# Patient Record
Sex: Male | Born: 1995 | Race: White | Hispanic: No | Marital: Single
Health system: Southern US, Community
[De-identification: ages and names within clinical notes are randomized; demographics above are authoritative.]

---

## 2018-10-18 ENCOUNTER — Emergency Department
Admission: EM | Admit: 2018-10-18 | Discharge: 2018-10-18 | Disposition: A | Discharge status: Home / Self Care | Payer: BC Managed Care – PPO | Attending: Emergency Medicine | Admitting: Emergency Medicine

## 2018-10-18 ENCOUNTER — Other Ambulatory Visit: Payer: Self-pay

## 2018-10-18 ENCOUNTER — Emergency Department: Payer: BC Managed Care – PPO

## 2018-10-18 DIAGNOSIS — R1032 Left lower quadrant pain: Secondary | ICD-10-CM | POA: Insufficient documentation

## 2018-10-18 DIAGNOSIS — R109 Unspecified abdominal pain: Secondary | ICD-10-CM

## 2018-10-18 DIAGNOSIS — N200 Calculus of kidney: Secondary | ICD-10-CM | POA: Diagnosis not present

## 2018-10-18 LAB — URINALYSIS, COMPLETE (UACMP) WITH MICROSCOPIC
Bacteria, UA: NONE SEEN
Bilirubin Urine: NEGATIVE
Glucose, UA: NEGATIVE mg/dL
Ketones, ur: NEGATIVE mg/dL
Leukocytes,Ua: NEGATIVE
Nitrite: NEGATIVE
Protein, ur: 30 mg/dL — AB
RBC / HPF: >50 RBC/hpf — ABNORMAL HIGH (ref 0–5)
Specific Gravity, Urine: 1.025 (ref 1.005–1.030)
pH: 5 (ref 5.0–8.0)

## 2018-10-18 LAB — CBC
HCT: 46 % (ref 39.0–52.0)
Hemoglobin: 15.5 g/dL (ref 13.0–17.0)
MCH: 29.6 pg (ref 26.0–34.0)
MCHC: 33.7 g/dL (ref 30.0–36.0)
MCV: 87.8 fL (ref 80.0–100.0)
Platelets: 285 10*3/uL (ref 150–400)
RBC: 5.24 MIL/uL (ref 4.22–5.81)
RDW: 11.9 % (ref 11.5–15.5)
WBC: 7.1 10*3/uL (ref 4.0–10.5)
nRBC: 0 % (ref 0.0–0.2)

## 2018-10-18 LAB — BASIC METABOLIC PANEL
Anion gap: 7 (ref 5–15)
BUN: 21 mg/dL — ABNORMAL HIGH (ref 6–20)
CO2: 26 mmol/L (ref 22–32)
Calcium: 8.9 mg/dL (ref 8.9–10.3)
Chloride: 105 mmol/L (ref 98–111)
Creatinine, Ser: 0.87 mg/dL (ref 0.61–1.24)
GFR calc Af Amer: >60 mL/min (ref >60)
GFR calc non Af Amer: >60 mL/min (ref >60)
Glucose, Bld: 115 mg/dL — ABNORMAL HIGH (ref 70–99)
Potassium: 3.8 mmol/L (ref 3.5–5.1)
Sodium: 138 mmol/L (ref 135–145)

## 2018-10-18 MED ORDER — KETOROLAC TROMETHAMINE 30 MG/ML IJ SOLN
15.0000 mg | Freq: Once | INTRAMUSCULAR | Status: AC
  Administered 2018-10-18: 15 mg via INTRAVENOUS
  Filled 2018-10-18: qty 1

## 2018-10-18 MED ORDER — ONDANSETRON 4 MG PO TBDP: 4.0000 mg | ORAL_TABLET | Freq: Four times a day (QID) | ORAL | 0 refills | Status: AC | PRN

## 2018-10-18 MED ORDER — HYDROCODONE-ACETAMINOPHEN 5-325 MG PO TABS: 1.0000 | ORAL_TABLET | Freq: Four times a day (QID) | ORAL | 0 refills | Status: AC | PRN

## 2018-10-18 MED ORDER — SODIUM CHLORIDE 0.9 % IV BOLUS
1000.0000 mL | Freq: Once | INTRAVENOUS | Status: AC
  Administered 2018-10-18: 1000 mL via INTRAVENOUS

## 2018-10-18 MED ORDER — MORPHINE SULFATE (PF) 4 MG/ML IV SOLN
6.0000 mg | Freq: Once | INTRAVENOUS | Status: AC
  Administered 2018-10-18: 06:00:00 6 mg via INTRAVENOUS
  Filled 2018-10-18: qty 2

## 2018-10-18 NOTE — ED Notes (Signed)
Pt c/o severe pain with sudden onset that woke pt this am, pt denies hx of stones but reports poor "flow", father at bedside reports family hx of same

## 2018-10-18 NOTE — ED Notes (Signed)
Discussed results with Dr Ellender Hose; he would prefer pt to be seen by provider before any further orders are given; pt waiting patiently in waiting room for treatment room; awake and alert;

## 2018-10-18 NOTE — Discharge Instructions (Signed)
You have been seen in the Emergency Department (ED) today for pain that we believe based on your workup, is caused by kidney stones.  As we have discussed, please drink plenty of fluids.  Please make a follow up appointment with the physician(s) listed elsewhere in this documentation. ° °You may take pain medication as needed but ONLY as prescribed.   We also recommend that you take over-the-counter ibuprofen regularly according to label instructions over the next 5 days.  Take it with meals to minimize stomach discomfort. ° °Please see your doctor as soon as possible as stones may take 1-3 weeks to pass and you may require additional care or medications. ° °Do not drink alcohol, drive or participate in any other potentially dangerous activities while taking opiate pain medication as it may make you sleepy. Do not take this medication with any other sedating medications, either prescription or over-the-counter. If you were prescribed Percocet or Vicodin, do not take these with acetaminophen (Tylenol) as it is already contained within these medications. °  °This medication is an opiate (or narcotic) pain medication and can be habit forming.  Use it as little as possible to achieve adequate pain control.  Do not use or use it with extreme caution if you have a history of opiate abuse or dependence.  If you are on a pain contract with your primary care doctor or a pain specialist, be sure to let them know you were prescribed this medication today from the [facility].  This medication is intended for your use only - do not give any to anyone else and keep it in a secure place where nobody else, especially children, have access to it.  It will also cause or worsen constipation, so you may want to consider taking an over-the-counter stool softener while you are taking this medication. ° °Return to the Emergency Department (ED) or call your doctor if you have any worsening pain, fever, painful  urination, are unable to urinate, or develop other symptoms that concern you. ° °

## 2018-10-18 NOTE — ED Provider Notes (Signed)
3 mm stone position within the posterior bladder lumen just beyond the left UVJ, consistent with a recently passed stone. No significant hydronephrosis or hydroureter. 2. Additional bilateral nonobstructive nephrolithiasis as above.   CT imaging reviewed.  Reviewed diagnosis, reevaluate patient.  Is resting comfortably pain is much better.  Follow the bedside as well.  He is comfortable with plan for discharge. I will prescribe the patient a narcotic pain medicine due to their condition which I anticipate will cause at least moderate pain short term. I discussed with the patient safe use of narcotic pain medicines, and that they are not to drive, work in dangerous areas, or ever take more than prescribed (no more than 1 pill every 6 hours). We discussed that this is the type of medication that can be  overdosed on and the risks of this type of medicine. Patient is very agreeable to only use as prescribed and to never use more than prescribed.   Father will be driving home.  He will follow-up with urology.  Pain well controlled.  No evidence of acute infectious etiology.  Stable for outpatient management of kidney stone.   Delman Kitten, MD 10/18/18 0900

## 2018-10-18 NOTE — ED Notes (Signed)
Patient transported to CT 

## 2018-10-18 NOTE — ED Provider Notes (Signed)
[facility] Emergency Department Provider Note  ____________________________________________   First MD Initiated Contact with Patient 10/18/18 [phone]     (approximate)  I have reviewed the triage vital signs and the nursing notes.   HISTORY  Chief Complaint Flank Pain    HPI Mitchell Bautista is a 23 y.o. male here with left flank pain.  The patient states he was in his usual state of health until prior to arrival.  Reports that he felt the need to urinate and experienced acute onset of severe, aching, gnawing, left flank pain.  He had associated nausea and vomiting.  He states that the pain has persisted and was severe.  He had some mild improvement but then returned to being severe while in the waiting room.  Scribes as aching, gnawing, cramp-like sensation.  Does not move.  No leaving factors.  No history of similar symptoms.  Has a family history of kidney stones but no personal history.        No past medical history on file. PMHx: None  PSHx: none, no intra abd surgeries  Shx; noncontributory  There are no active problems to display for this patient.    Prior to Admission medications   Medication Sig Start Date End Date Taking? Authorizing Provider  HYDROcodone-acetaminophen (NORCO/VICODIN) 5-325 MG tablet Take 1-2 tablets by mouth every 6 (six) hours as needed for moderate pain. 10/18/18   Sharyn CreamerQuale, Mark, MD  ondansetron (ZOFRAN ODT) 4 MG disintegrating tablet Take 1 tablet (4 mg total) by mouth every 6 (six) hours as needed for nausea or vomiting. 10/18/18   Sharyn CreamerQuale, Mark, MD    Allergies Patient has no known allergies.  No family history on file.  Social History Social History   Tobacco Use   Smoking status: Not on file  Substance Use Topics   Alcohol use: Not on file   Drug use: Not on file    Review of Systems  Review of Systems  Constitutional: Negative for chills, fatigue and fever.  HENT: Negative for sore throat.   Respiratory:  Negative for shortness of breath.   Cardiovascular: Negative for chest pain.  Gastrointestinal: Positive for abdominal pain, nausea and vomiting.  Genitourinary: Positive for flank pain and frequency.  Musculoskeletal: Negative for neck pain.  Skin: Negative for rash and wound.  Allergic/Immunologic: Negative for immunocompromised state.  Neurological: Negative for weakness and numbness.  Hematological: Does not bruise/bleed easily.     ____________________________________________  PHYSICAL EXAM:      VITAL SIGNS: ED Triage Vitals  Enc Vitals Group     BP 10/18/18 0413 127/75     Pulse Rate 10/18/18 0413 79     Resp 10/18/18 0413 18     Temp 10/18/18 0413 (!) 97.5 F (36.4 C)     Temp Source 10/18/18 0413 Oral     SpO2 10/18/18 0413 100 %     Weight 10/18/18 0410 153 lb (69.4 kg)     Height 10/18/18 0410 5\' 7"  (1.702 m)     Head Circumference --      Peak Flow --      Pain Score 10/18/18 0410 8     Pain Loc --      Pain Edu? --      Excl. in GC? --      Physical Exam Vitals signs and nursing note reviewed.  Constitutional:      General: He is not in acute distress.    Appearance: He is well-developed.  HENT:  Head: Normocephalic and atraumatic.  Eyes:     Conjunctiva/sclera: Conjunctivae normal.  Neck:     Musculoskeletal: Neck supple.  Cardiovascular:     Rate and Rhythm: Normal rate and regular rhythm.     Heart sounds: Normal heart sounds. No murmur. No friction rub.  Pulmonary:     Effort: Pulmonary effort is normal. No respiratory distress.     Breath sounds: Normal breath sounds. No wheezing or rales.  Abdominal:     General: There is no distension.     Palpations: Abdomen is soft.     Tenderness: There is abdominal tenderness in the left upper quadrant and left lower quadrant. There is no right CVA tenderness, left CVA tenderness, guarding or rebound.  Skin:    General: Skin is warm.     Capillary Refill: Capillary refill takes less than 2 seconds.   Neurological:     Mental Status: He is alert and oriented to person, place, and time.     Motor: No abnormal muscle tone.       ____________________________________________   LABS (all labs ordered are listed, but only abnormal results are displayed)  Labs Reviewed  URINALYSIS, COMPLETE (UACMP) WITH MICROSCOPIC - Abnormal; Notable for the following components:      Result Value   Color, Urine YELLOW (*)    APPearance HAZY (*)    Hgb urine dipstick LARGE (*)    Protein, ur 30 (*)    RBC / HPF >50 (*)    All other components within normal limits  BASIC METABOLIC PANEL - Abnormal; Notable for the following components:   Glucose, Bld 115 (*)    BUN 21 (*)    All other components within normal limits  CBC    ____________________________________________  EKG: None ________________________________________  RADIOLOGY All imaging, including plain films, CT scans, and ultrasounds, independently reviewed by me, and interpretations confirmed via formal radiology reads.  ED MD interpretation:   None  Official radiology report(s): Ct Renal Stone Study  Result Date: 10/18/2018 CLINICAL DATA:  Initial evaluation for acute left flank pain. EXAM: CT ABDOMEN AND PELVIS WITHOUT CONTRAST TECHNIQUE: Multidetector CT imaging of the abdomen and pelvis was performed following the standard protocol without IV contrast. COMPARISON:  None. FINDINGS: Lower chest: Visualized lung bases are clear. Hepatobiliary: Limited noncontrast evaluation liver is unremarkable. Gallbladder within normal limits. No biliary dilatation. Pancreas: Pancreas within normal limits. Spleen: Spleen within normal limits. Adrenals/Urinary Tract: Adrenal glands are normal. On the left, there is a 3 mm stone position within the posterior bladder lumen just beyond the left UVJ, consistent with a recently passed stone. No significant hydronephrosis or hydroureter. No other radiopaque calculi seen along the course of either renal  collecting system. Additional scattered nonobstructive calculi measuring 2-3 mm present within the kidneys bilaterally. Partially distended bladder otherwise within normal limits. Stomach/Bowel: Stomach within normal limits. No evidence for bowel obstruction. No acute inflammatory changes seen about the bowels. Negative appendix. Vascular/Lymphatic: Intra-abdominal aorta of normal caliber. No adenopathy. Reproductive: Prostate and seminal vesicles within normal limits. Bilateral hydroceles with mild thickening of the overlying scrotal wall noted. Other: No free air or fluid. Musculoskeletal: No acute osseous finding. No discrete lytic or blastic osseous lesions. IMPRESSION: 1. 3 mm stone position within the posterior bladder lumen just beyond the left UVJ, consistent with a recently passed stone. No significant hydronephrosis or hydroureter. 2. Additional bilateral nonobstructive nephrolithiasis as above. 3. No other acute intra-abdominal or pelvic process. 4. Bilateral hydroceles, partially visualized. Electronically  Signed   By: Jeannine Boga M.D.   On: 10/18/2018 08:02    ____________________________________________  PROCEDURES   Procedure(s) performed (including Critical Care):  Procedures  ____________________________________________  INITIAL IMPRESSION / MDM / [place] / ED COURSE  As part of my medical decision making, I reviewed the following data within the electronic MEDICAL RECORD NUMBER Notes from prior ED visits and [place] Controlled Substance Database      *Mitchell Bautista was evaluated in Emergency Department on 10/18/2018 for the symptoms described in the history of present illness. He was evaluated in the context of the global COVID-19 pandemic, which necessitated consideration that the patient might be at risk for infection with the SARS-CoV-2 virus that causes COVID-19. Institutional protocols and algorithms that pertain to the evaluation of patients at risk for COVID-19  are in a state of rapid change based on information released by regulatory bodies including the CDC and federal and state organizations. These policies and algorithms were followed during the patient's care in the ED.  Some ED evaluations and interventions may be delayed as a result of limited staffing during the pandemic.*      Medical Decision Making: 22 year old male here with left flank pain, acute in onset.  Suspect stone disease.  Patient has hematuria noted on UA.  Labs show possible mild dehydration with BUN to creatinine ratio greater than 20, but otherwise unremarkable with normal renal function. Given that this is his first time, will obtain CT stone study.  IV fluids and analgesia given.   Patient care transferred to Dr. Jacqualine Code at the end of my shift. Patient presentation, ED course, and plan of care discussed with review of all pertinent labs and imaging. Please see his/her note for further details regarding further ED course and disposition.   ____________________________________________  FINAL CLINICAL IMPRESSION(S) / ED DIAGNOSES  Final diagnoses:  Left flank pain  Kidney stone     MEDICATIONS GIVEN DURING THIS VISIT:  Medications  ketorolac (TORADOL) 30 MG/ML injection 15 mg (15 mg Intravenous Given 10/18/18 0609)  morphine 4 MG/ML injection 6 mg (6 mg Intravenous Given 10/18/18 0611)  sodium chloride 0.9 % bolus 1,000 mL (1,000 mLs Intravenous New Bag/Given 10/18/18 0759)     ED Discharge Orders         Ordered    ondansetron (ZOFRAN ODT) 4 MG disintegrating tablet  Every 6 hours PRN     10/18/18 0859    HYDROcodone-acetaminophen (NORCO/VICODIN) 5-325 MG tablet  Every 6 hours PRN     10/18/18 0859           Note:  This document was prepared using Dragon voice recognition software and may include unintentional dictation errors.   Duffy Bruce, MD 10/18/18 [phone]

## 2018-10-18 NOTE — ED Triage Notes (Signed)
Patient reports left flank pain that started tonight

## 2020-09-11 IMAGING — CT CT RENAL STONE PROTOCOL
3 of 4 series · 10 of 46 positions shown, 15 images · non-contrast
Comparison: None.

CLINICAL DATA: Initial evaluation for acute left flank pain.

EXAM:
CT ABDOMEN AND PELVIS WITHOUT CONTRAST
TECHNIQUE: Multidetector CT imaging of the abdomen and pelvis was performed
following the standard protocol without IV contrast.

[Series 4: lung bases · axial · 0.69mm/px · z∈[-163,-63]mm · 6 of 30 slices shown, 11 images]
[im 5/30  soft-tissue]
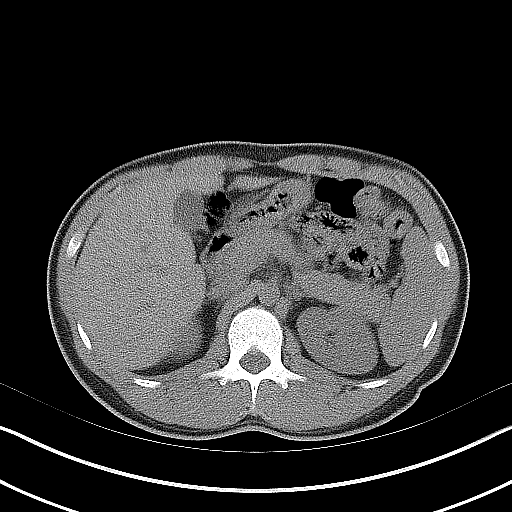
[im 5/30  bone]
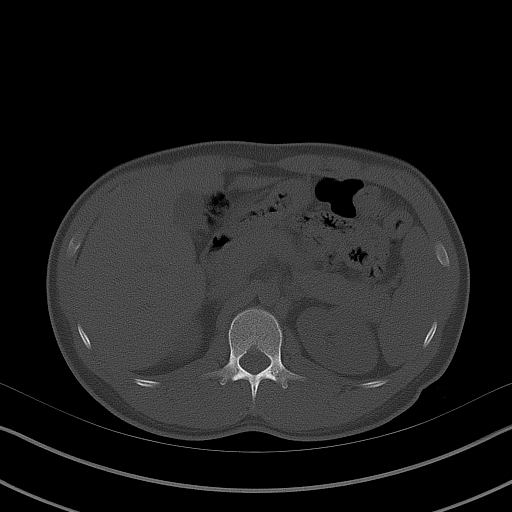
[im 9/30  soft-tissue]
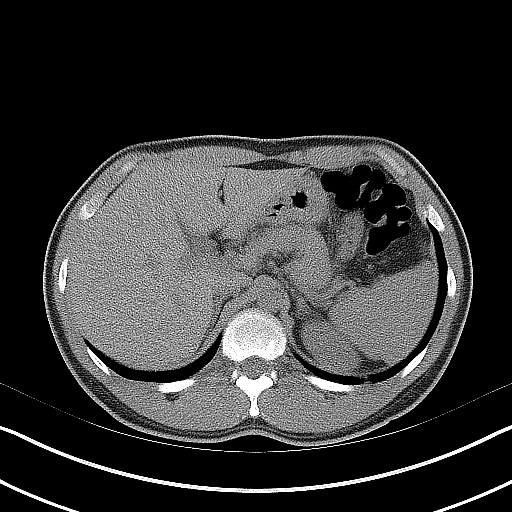
[im 13/30  soft-tissue]
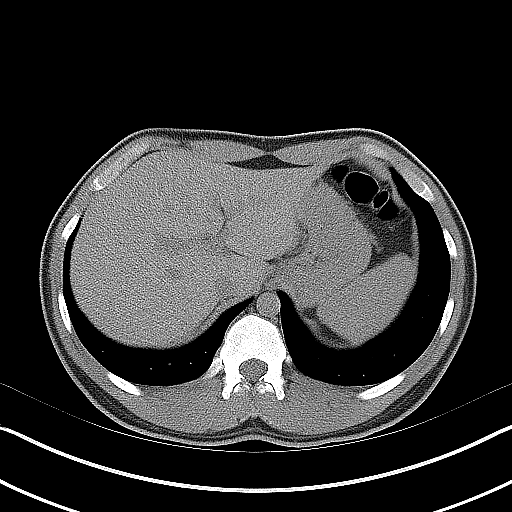
[im 13/30  lung]
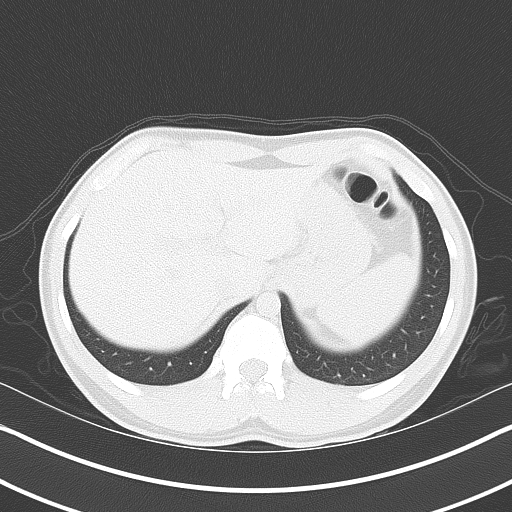
[im 17/30  soft-tissue]
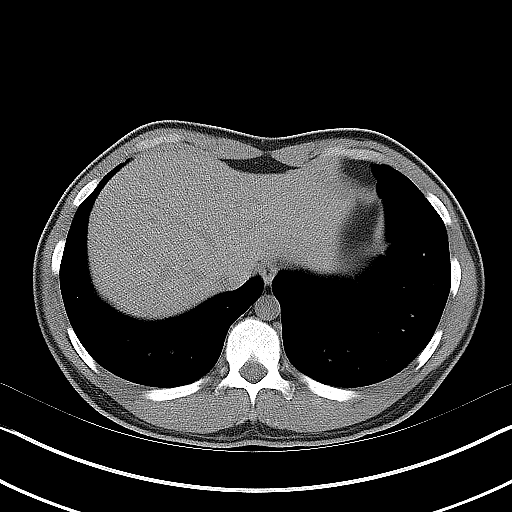
[im 17/30  lung]
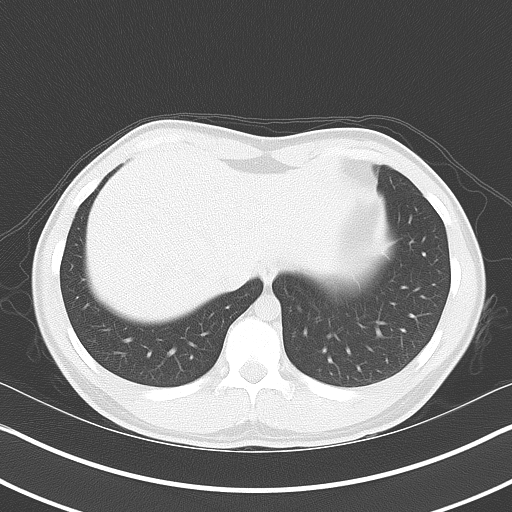
[im 21/30  soft-tissue]
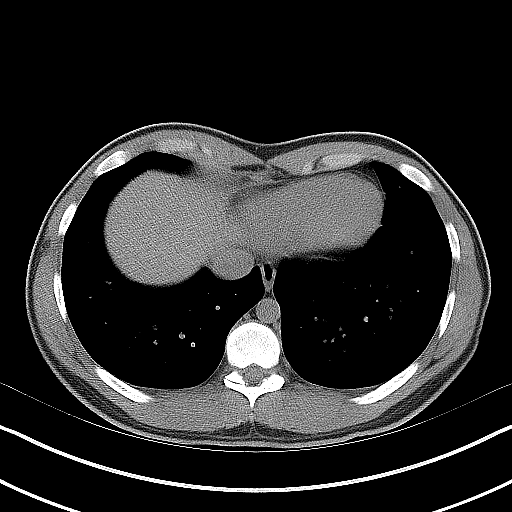
[im 21/30  lung]
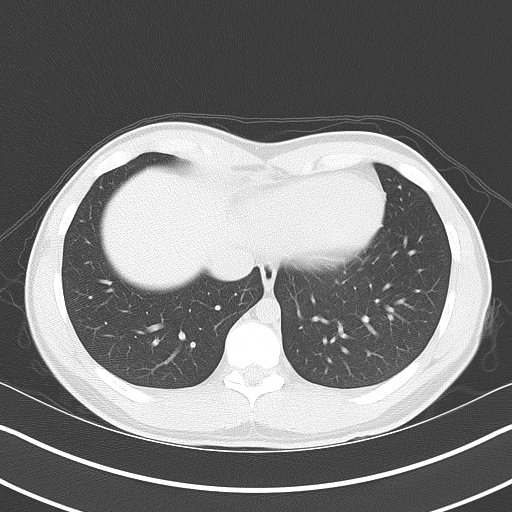
[im 25/30  soft-tissue]
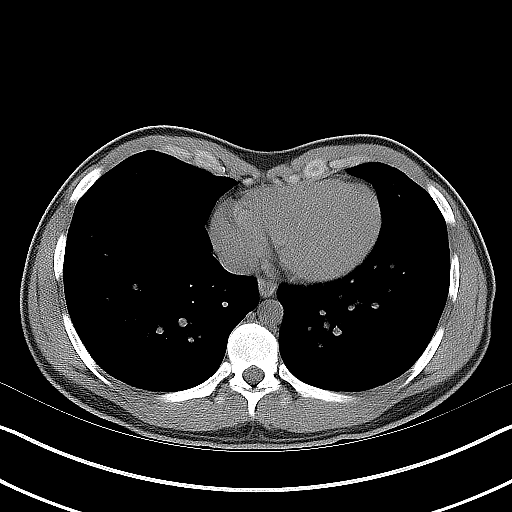
[im 25/30  lung]
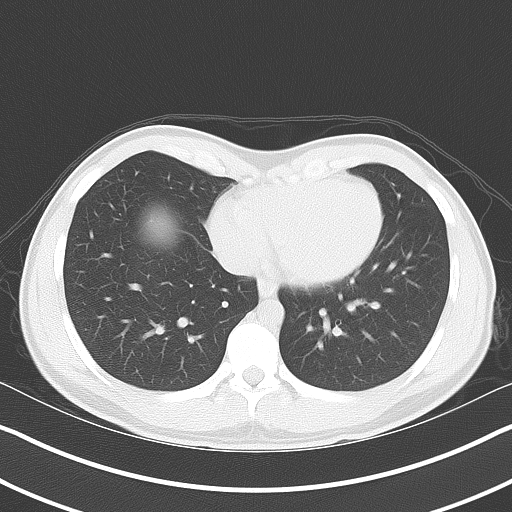

[Series 5: coronal · coronal · 0.72mm/px · 3 of 100 slices shown]
[im 34/100  soft-tissue]
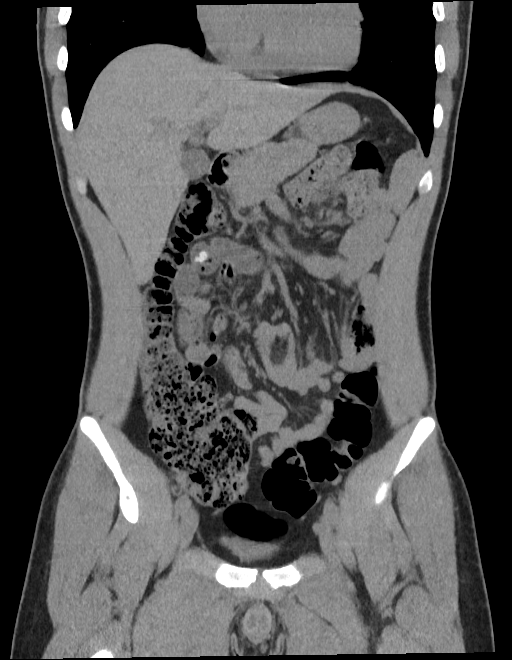
[im 45/100  soft-tissue]
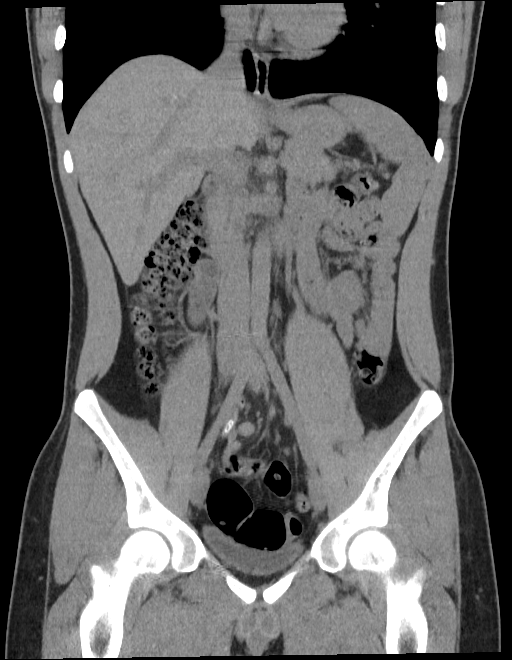
[im 56/100  soft-tissue]
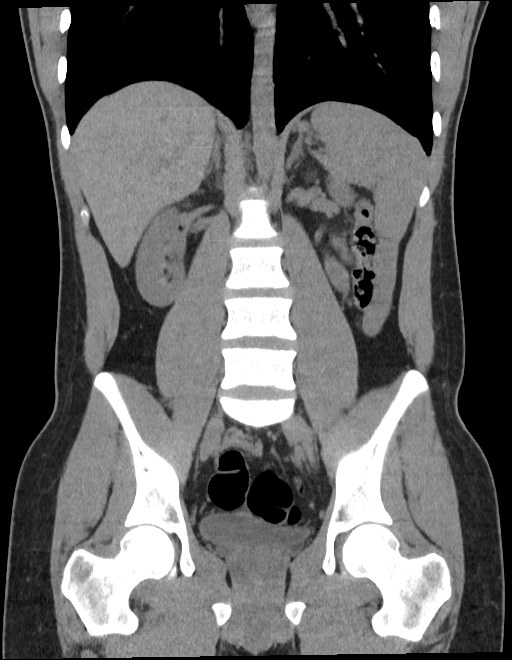

[Series 6: sagittal · sagittal · 0.45mm/px · 1 of 154 slices shown]
[im 52/154  soft-tissue]
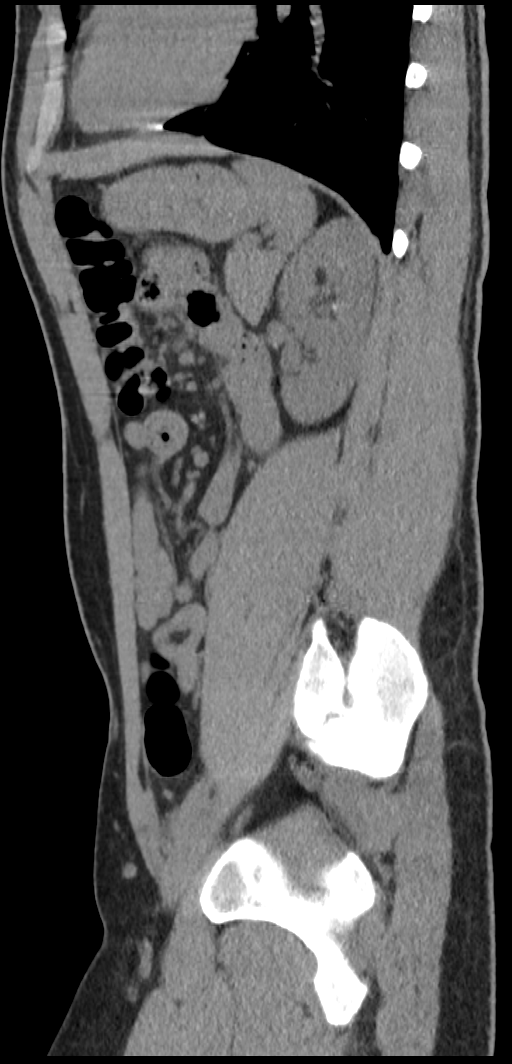

[10 of 46 positions shown; findings below may reference images not displayed]

FINDINGS: Lower chest: Visualized lung bases are clear.

Hepatobiliary: Limited noncontrast evaluation liver is unremarkable.
Gallbladder within normal limits. No biliary dilatation.

Pancreas: Pancreas within normal limits.

Spleen: Spleen within normal limits.

Adrenals/Urinary Tract: Adrenal glands are normal.

On the left, there is a 3 mm stone position within the posterior
bladder lumen just beyond the left UVJ, consistent with a recently
passed stone. No significant hydronephrosis or hydroureter. No other
radiopaque calculi seen along the course of either renal collecting
system. Additional scattered nonobstructive calculi measuring 2-3 mm
present within the kidneys bilaterally. Partially distended bladder
otherwise within normal limits.

Stomach/Bowel: Stomach within normal limits. No evidence for bowel
obstruction. No acute inflammatory changes seen about the bowels.
Negative appendix.

Vascular/Lymphatic: Intra-abdominal aorta of normal caliber. No
adenopathy.

Reproductive: Prostate and seminal vesicles within normal limits.
Bilateral hydroceles with mild thickening of the overlying scrotal
wall noted.

Other: No free air or fluid.

Musculoskeletal: No acute osseous finding. No discrete lytic or
blastic osseous lesions.
IMPRESSION: 1. 3 mm stone position within the posterior bladder lumen just
beyond the left UVJ, consistent with a recently passed stone. No
significant hydronephrosis or hydroureter.
2. Additional bilateral nonobstructive nephrolithiasis as above.
3. No other acute intra-abdominal or pelvic process.
4. Bilateral hydroceles, partially visualized.
